# Patient Record
Sex: Female | Born: 2005 | Race: Black or African American | Hispanic: No | Marital: Single | Smoking: Never smoker
Health system: Southern US, Community
[De-identification: ages and names within clinical notes are randomized; demographics above are authoritative.]

## PROBLEM LIST (undated history)

## (undated) DIAGNOSIS — D649 Anemia, unspecified: Secondary | ICD-10-CM

## (undated) HISTORY — PX: NO PAST SURGERIES: SHX2092

---
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
## 2024-01-22 ENCOUNTER — Ambulatory Visit

## 2024-01-22 NOTE — Progress Notes (Deleted)
    NURSE VISIT NOTE  Subjective:    Patient ID: Frances Mcfarland, female    DOB: 03-09-2006, 18 y.o.   MRN: [id number]  HPI  Patient is a 18 y.o. G48P1001 female who presents for {pe vag discharge desc:315065} vaginal discharge for *** {gen duration:315003}. Denies abnormal vaginal bleeding or significant pelvic pain or fever. {Actions; denies/reports/admits to:19208} {UTI Symptoms:210800002}. Patient {has/denies:315300} history of known exposure to STD.   Objective:    There were no vitals taken for this visit.   @THIS  VISIT ONLY@  Assessment:   No diagnosis found.  {vaginitis type:315262}  Plan:   GC and chlamydia DNA  probe sent to lab. Treatment: {vaginitis tx:315263} ROV prn if symptoms persist or worsen.   Harlene Gander, CMA

## 2024-02-29 ENCOUNTER — Ambulatory Visit: Admitting: Nurse Practitioner

## 2024-04-26 ENCOUNTER — Encounter: Payer: Self-pay | Admitting: Nurse Practitioner

## 2024-04-26 ENCOUNTER — Other Ambulatory Visit (HOSPITAL_COMMUNITY)
Admission: RE | Admit: 2024-04-26 | Discharge: 2024-04-26 | Disposition: A | Discharge status: Home / Self Care | Source: Ambulatory Visit | Attending: Nurse Practitioner | Admitting: Nurse Practitioner

## 2024-04-26 ENCOUNTER — Ambulatory Visit: Admitting: Nurse Practitioner

## 2024-04-26 VITALS — BP 100/72 | HR 96 | Temp 99.0°F | Ht 62.0 in | Wt 119.0 lb

## 2024-04-26 DIAGNOSIS — Z1159 Encounter for screening for other viral diseases: Secondary | ICD-10-CM

## 2024-04-26 DIAGNOSIS — Z131 Encounter for screening for diabetes mellitus: Secondary | ICD-10-CM

## 2024-04-26 DIAGNOSIS — R21 Rash and other nonspecific skin eruption: Secondary | ICD-10-CM

## 2024-04-26 DIAGNOSIS — Z130 Encounter for screening for diseases of the blood and blood-forming organs and certain disorders involving the immune mechanism: Secondary | ICD-10-CM

## 2024-04-26 DIAGNOSIS — Z13220 Encounter for screening for lipoid disorders: Secondary | ICD-10-CM

## 2024-04-26 DIAGNOSIS — N912 Amenorrhea, unspecified: Secondary | ICD-10-CM

## 2024-04-26 DIAGNOSIS — Z114 Encounter for screening for human immunodeficiency virus [HIV]: Secondary | ICD-10-CM

## 2024-04-26 DIAGNOSIS — F3481 Disruptive mood dysregulation disorder: Secondary | ICD-10-CM

## 2024-04-26 DIAGNOSIS — Z113 Encounter for screening for infections with a predominantly sexual mode of transmission: Secondary | ICD-10-CM

## 2024-04-26 LAB — POCT URINE PREGNANCY: Preg Test, Ur: NEGATIVE

## 2024-04-26 MED ORDER — CLOTRIMAZOLE 1 % EX CREA: TOPICAL_CREAM | Freq: Two times a day (BID) | CUTANEOUS | 0 refills | Status: AC

## 2024-04-26 NOTE — Progress Notes (Signed)
 BP 100/72   Pulse 96   Temp 99 F (37.2 C)   Ht 5' 2 (1.575 m)   Wt 119 lb (54 kg)   SpO2 99%   BMI 21.77 kg/m    Subjective:    Patient ID: Frances Mcfarland, female    DOB: 10-24-2005, 18 y.o.   MRN: [id number]  HPI: Frances Mcfarland is a 18 y.o. female  Chief Complaint  Patient presents with   Establish Care   Discussed the use of AI scribe software for clinical note transcription with the patient, who gave verbal consent to proceed.  History of Present Illness Frances Mcfarland is a 18 year old female who presents to establish care and address skin concerns related to her birth control patch.  Pruritic skin lesions - Small, dry, itchy spots have developed on various areas of the body since starting Xulane transdermal patch for contraception. - Lesions are associated with significant pruritus. - Campo Panique and cortisone cream have been used without relief. - Lesions persist despite topical treatments.  Contraceptive use and menstrual changes - Currently using Xulane transdermal patch, applied to the thigh, for contraception. - Has been on the patch since before pregnancy. - Experiencing amenorrhea since last year, despite expected menstrual cycle with the patch. - Previously used Depo-Provera , which also resulted in amenorrhea. - Last Depo-Provera  injection was in February or March. - Switched to the patch due to migraines associated with Depo-Provera .  Sexual health concerns - Interested in STI/STD testing. - Has not had a primary care provider and previous OB GYN visits did not include comprehensive blood work.  Renal health concerns - History of kidney infection. - Concerned about possible kidney damage.         04/26/2024   10:22 AM 02/13/2021    3:18 PM  Depression screen PHQ 2/9  Decreased Interest 0   Down, Depressed, Hopeless 0   PHQ - 2 Score 0   Altered sleeping    Tired, decreased energy    Change in appetite    Feeling bad or failure about  yourself     Trouble concentrating    Moving slowly or fidgety/restless    Suicidal thoughts    PHQ-9 Score       Information is confidential and restricted. Go to Review Flowsheets to unlock data.    Relevant past medical, surgical, family and social history reviewed and updated as indicated. Interim medical history since our last visit reviewed. Allergies and medications reviewed and updated.  Review of Systems  Constitutional: Negative for fever or weight change.  Respiratory: Negative for cough and shortness of breath.   Cardiovascular: Negative for chest pain or palpitations.  Gastrointestinal: Negative for abdominal pain, no bowel changes.  Musculoskeletal: Negative for gait problem or joint swelling.  Skin: Negative for rash.  Neurological: Negative for dizziness or headache.  No other specific complaints in a complete review of systems (except as listed in HPI above).      Objective:      BP 100/72   Pulse 96   Temp 99 F (37.2 C)   Ht 5' 2 (1.575 m)   Wt 119 lb (54 kg)   SpO2 99%   BMI 21.77 kg/m    Wt Readings from Last 3 Encounters:  04/26/24 119 lb (54 kg) (33%, Z= -0.44)*  08/07/23 121 lb (54.9 kg) (41%, Z= -0.24)*  07/02/23 116 lb (52.6 kg) (30%, Z= -0.51)*   * Growth percentiles are based on CDC (  Girls, 2-20 Years) data.    Physical Exam GENERAL: Alert, cooperative, well developed, no acute distress HEENT: Normocephalic, normal oropharynx, moist mucous membranes CHEST: Clear to auscultation bilaterally, no wheezes, rhonchi, or crackles CARDIOVASCULAR: Normal heart rate and rhythm, S1 and S2 normal without murmurs ABDOMEN: Soft, non-tender, non-distended, without organomegaly, normal bowel sounds EXTREMITIES: No cyanosis or edema NEUROLOGICAL: Cranial nerves grossly intact, moves all extremities without gross motor or sensory deficit          Assessment & Plan:   Problem List Items Addressed This Visit       Other   DMDD (disruptive mood  dysregulation disorder) - Primary   Other Visit Diagnoses       Rash       Relevant Medications   clotrimazole  (LOTRIMIN ) 1 % cream     Screening for HIV without presence of risk factors       Relevant Orders   HIV Antibody (routine testing w rflx)     Encounter for hepatitis C screening test for low risk patient       Relevant Orders   Hepatitis C antibody     Screening examination for STD (sexually transmitted disease)       Relevant Orders   Hepatitis C antibody   HIV Antibody (routine testing w rflx)   Cervicovaginal ancillary only   RPR W/RFLX TO RPR TITER, TREPONEMAL AB, SCREEN AND DIAGNOSIS     Screening for deficiency anemia       Relevant Orders   CBC with Differential/Platelet     Screening for cholesterol level       Relevant Orders   Lipid panel     Screening for diabetes mellitus       Relevant Orders   Comprehensive metabolic panel with GFR   Hemoglobin A1c     Amenorrhea       Relevant Orders   TSH   POCT urine pregnancy (Completed)        Assessment and Plan Assessment & Plan Rash possibly related to hormonal contraception Rash characterized by dry, itchy spots appearing since starting the Xulane transdermal patch. Differential diagnosis includes allergic reaction versus fungal infection. Previous treatments with cortisone and Campo Panique were ineffective. - Prescribed antifungal cream to assess response - If antifungal cream is ineffective, will consider allergy medication  Amenorrhea Since starting the Xulane patch, with no periods since last year. Previous use of Depo-Provera  also resulted in amenorrhea. Differential diagnosis includes hormonal effects of contraception versus thyroid dysfunction. - Ordered thyroid function tests - Ordered urine pregnancy test- negative - Ordered vaginal swab for STD testing  Disruptive mood dysregulation disorder -on birthcontrol patch  General Health Maintenance Routine health maintenance discussed,  including STD screening and blood work. - Ordered comprehensive metabolic panel (CMP) - Ordered cholesterol panel - Ordered STD testing - Ordered complete blood count (CBC)        Follow up plan: Return in about 1 year (around 04/26/2025) for cpe.

## 2024-04-27 ENCOUNTER — Ambulatory Visit: Payer: Self-pay | Admitting: Nurse Practitioner

## 2024-04-27 DIAGNOSIS — B379 Candidiasis, unspecified: Secondary | ICD-10-CM

## 2024-04-27 LAB — CERVICOVAGINAL ANCILLARY ONLY
Bacterial Vaginitis (gardnerella): NEGATIVE
Candida Glabrata: NEGATIVE
Candida Vaginitis: POSITIVE — AB
Chlamydia: NEGATIVE
Comment: NEGATIVE
Comment: NEGATIVE
Comment: NEGATIVE
Comment: NEGATIVE
Comment: NEGATIVE
Comment: NORMAL
Neisseria Gonorrhea: NEGATIVE
Trichomonas: NEGATIVE

## 2024-04-27 MED ORDER — FLUCONAZOLE 150 MG PO TABS: 150.0000 mg | ORAL_TABLET | ORAL | 0 refills | Status: DC | PRN

## 2024-04-29 LAB — TSH: TSH: 1.37 m[IU]/L

## 2024-04-30 LAB — COMPREHENSIVE METABOLIC PANEL WITH GFR
AG Ratio: 1.8 (calc) (ref 1.0–2.5)
ALT: 8 U/L (ref 5–32)
AST: 14 U/L (ref 12–32)
Albumin: 4.9 g/dL (ref 3.6–5.1)
Alkaline phosphatase (APISO): 77 U/L (ref 36–128)
BUN/Creatinine Ratio: 10 (calc) (ref 6–22)
BUN: 10 mg/dL (ref 7–20)
CO2: 26 mmol/L (ref 20–32)
Calcium: 10.1 mg/dL (ref 8.9–10.4)
Chloride: 105 mmol/L (ref 98–110)
Creat: 0.99 mg/dL — ABNORMAL HIGH (ref 0.50–0.96)
Globulin: 2.7 g/dL (ref 2.0–3.8)
Glucose, Bld: 86 mg/dL (ref 65–99)
Potassium: 4.9 mmol/L (ref 3.8–5.1)
Sodium: 139 mmol/L (ref 135–146)
Total Bilirubin: 0.6 mg/dL (ref 0.2–1.1)
Total Protein: 7.6 g/dL (ref 6.3–8.2)
eGFR: 84 mL/min/1.73m2 (ref >=60)

## 2024-04-30 LAB — LIPID PANEL
Cholesterol: 196 mg/dL — ABNORMAL HIGH (ref <170)
HDL: 62 mg/dL (ref >45)
LDL Cholesterol (Calc): 119 mg/dL — ABNORMAL HIGH (ref <110)
Non-HDL Cholesterol (Calc): 134 mg/dL — ABNORMAL HIGH (ref <120)
Total CHOL/HDL Ratio: 3.2 (calc) (ref <5.0)
Triglycerides: 54 mg/dL (ref <90)

## 2024-04-30 LAB — CBC WITH DIFFERENTIAL/PLATELET
Absolute Lymphocytes: 2649 {cells}/uL (ref 850–3900)
Absolute Monocytes: 510 {cells}/uL (ref 200–950)
Basophils Absolute: 62 {cells}/uL (ref 0–200)
Basophils Relative: 0.7 %
Eosinophils Absolute: 194 {cells}/uL (ref 15–500)
Eosinophils Relative: 2.2 %
HCT: 41.9 % (ref 35.9–46.0)
Hemoglobin: 13.8 g/dL (ref 11.7–15.5)
MCH: 29.4 pg (ref 27.0–33.0)
MCHC: 32.9 g/dL (ref 31.6–35.4)
MCV: 89.1 fL (ref 81.4–101.7)
MPV: 10.2 fL (ref 7.5–12.5)
Monocytes Relative: 5.8 %
Neutro Abs: 5386 {cells}/uL (ref 1500–7800)
Neutrophils Relative %: 61.2 %
Platelets: 270 Thousand/uL (ref 140–400)
RBC: 4.7 Million/uL (ref 3.80–5.10)
RDW: 12.1 % (ref 11.0–15.0)
Total Lymphocyte: 30.1 %
WBC: 8.8 Thousand/uL (ref 3.8–10.8)

## 2024-04-30 LAB — HEMOGLOBIN A1C
Hgb A1c MFr Bld: 4.7 % (ref <5.7)
Mean Plasma Glucose: 88 mg/dL
eAG (mmol/L): 4.9 mmol/L

## 2024-04-30 LAB — HIV ANTIBODY (ROUTINE TESTING W REFLEX)
HIV 1&2 Ab, 4th Generation: NONREACTIVE
HIV FINAL INTERPRETATION: NEGATIVE

## 2024-04-30 LAB — HEPATITIS C ANTIBODY: Hepatitis C Ab: NONREACTIVE

## 2024-04-30 LAB — SYPHILIS: RPR W/REFLEX TO RPR TITER AND TREPONEMAL ANTIBODIES, TRADITIONAL SCREENING AND DIAGNOSIS ALGORITHM: RPR Ser Ql: NONREACTIVE

## 2024-05-02 ENCOUNTER — Ambulatory Visit: Admitting: Obstetrics & Gynecology

## 2024-05-02 ENCOUNTER — Encounter: Payer: Self-pay | Admitting: Obstetrics & Gynecology

## 2024-05-02 VITALS — BP 112/78 | HR 88 | Ht 62.0 in | Wt 119.0 lb

## 2024-05-02 DIAGNOSIS — N926 Irregular menstruation, unspecified: Secondary | ICD-10-CM

## 2024-05-02 DIAGNOSIS — Z8619 Personal history of other infectious and parasitic diseases: Secondary | ICD-10-CM

## 2024-05-02 NOTE — Progress Notes (Signed)
° ° °  GYNECOLOGY PROGRESS NOTE  Subjective:    Patient ID: Frances Mcfarland, female    DOB: 10-19-2005, 18 y.o.   MRN: [id number]  HPI  Patient is a 18 y.o. G55P1001 (2 y o son [name]) here today referred by her primary care provider to discuss why she hasn't had a period since 08/2023. She started using depo provera  after the birth of her son. She then used Xulane 8/ 2024 until 04/2023. She stopped xulane patch 08/2023, not using any contraception. She got a depo provera  shot 06/2023. Her TSH was normal recently.   The following portions of the patient's history were reviewed and updated as appropriate: allergies, current medications, past family history, past medical history, past social history, past surgical history, and problem list.  Review of Systems Pertinent items are noted in HPI.   Objective:   Blood pressure 112/78, pulse 88, height 5' 2 (1.575 m), weight 119 lb (54 kg), last menstrual period 03/20/2023. Body mass index is 21.77 kg/m. General appearance: alert Exam deferred   Assessment:   Missed periods- probably due to depo provera  (last given 06/2023)   Plan:    2. Missed periods I will check a prolactin level today but I have reassured her that her period will eventually return. I offered cyclic provera  challenge. She declines contraception at this time.

## 2024-05-03 LAB — PROLACTIN: Prolactin: 7.7 ng/mL (ref 4.8–33.4)

## 2025-04-27 ENCOUNTER — Encounter: Admitting: Nurse Practitioner
